# Patient Record
Sex: Female | Born: 1960 | Race: White | Hispanic: No | Marital: Married | Smoking: Current every day smoker
Health system: Southern US, Community
[De-identification: ages and names within clinical notes are randomized; demographics above are authoritative.]

## PROBLEM LIST (undated history)

## (undated) DIAGNOSIS — E079 Disorder of thyroid, unspecified: Secondary | ICD-10-CM

## (undated) DIAGNOSIS — Z860100 Personal history of colon polyps, unspecified: Secondary | ICD-10-CM

## (undated) DIAGNOSIS — F329 Major depressive disorder, single episode, unspecified: Secondary | ICD-10-CM

## (undated) DIAGNOSIS — T7840XA Allergy, unspecified, initial encounter: Secondary | ICD-10-CM

## (undated) DIAGNOSIS — M5030 Other cervical disc degeneration, unspecified cervical region: Secondary | ICD-10-CM

## (undated) DIAGNOSIS — F419 Anxiety disorder, unspecified: Secondary | ICD-10-CM

## (undated) DIAGNOSIS — D649 Anemia, unspecified: Secondary | ICD-10-CM

## (undated) DIAGNOSIS — K219 Gastro-esophageal reflux disease without esophagitis: Secondary | ICD-10-CM

## (undated) DIAGNOSIS — M1990 Unspecified osteoarthritis, unspecified site: Secondary | ICD-10-CM

## (undated) DIAGNOSIS — F32A Depression, unspecified: Secondary | ICD-10-CM

## (undated) HISTORY — DX: Gastro-esophageal reflux disease without esophagitis: K21.9

## (undated) HISTORY — DX: Disorder of thyroid, unspecified: E07.9

## (undated) HISTORY — DX: Allergy, unspecified, initial encounter: T78.40XA

## (undated) HISTORY — DX: Anxiety disorder, unspecified: F41.9

## (undated) HISTORY — PX: THYROIDECTOMY, PARTIAL: SHX18

## (undated) HISTORY — PX: BREAST BIOPSY: SHX20

## (undated) HISTORY — DX: Unspecified osteoarthritis, unspecified site: M19.90

## (undated) HISTORY — PX: TUBAL LIGATION: SHX77

## (undated) HISTORY — PX: CHOLECYSTECTOMY: SHX55

## (undated) HISTORY — DX: Anemia, unspecified: D64.9

## (undated) HISTORY — DX: Other cervical disc degeneration, unspecified cervical region: M50.30

## (undated) HISTORY — DX: Depression, unspecified: F32.A

## (undated) HISTORY — DX: Personal history of colonic polyps: Z86.010

## (undated) HISTORY — DX: Personal history of colon polyps, unspecified: Z86.0100

---
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
## 1997-05-14 ENCOUNTER — Other Ambulatory Visit: Admission: RE | Admit: 1997-05-14 | Discharge: 1997-05-14 | Payer: Self-pay | Admitting: Obstetrics & Gynecology

## 1997-06-08 ENCOUNTER — Inpatient Hospital Stay (HOSPITAL_COMMUNITY): Admission: AD | Admit: 1997-06-08 | Discharge: 1997-06-10 | Payer: Self-pay | Admitting: Obstetrics and Gynecology

## 1997-08-26 ENCOUNTER — Ambulatory Visit (HOSPITAL_COMMUNITY): Admission: RE | Admit: 1997-08-26 | Discharge: 1997-08-26 | Payer: Self-pay | Admitting: Obstetrics and Gynecology

## 1997-10-07 ENCOUNTER — Other Ambulatory Visit: Admission: RE | Admit: 1997-10-07 | Discharge: 1997-10-07 | Payer: Self-pay | Admitting: Obstetrics and Gynecology

## 1998-08-14 ENCOUNTER — Encounter: Payer: Self-pay | Admitting: General Surgery

## 1998-08-18 ENCOUNTER — Ambulatory Visit (HOSPITAL_COMMUNITY): Admission: RE | Admit: 1998-08-18 | Discharge: 1998-08-19 | Payer: Self-pay | Admitting: General Surgery

## 1999-01-08 ENCOUNTER — Other Ambulatory Visit: Admission: RE | Admit: 1999-01-08 | Discharge: 1999-01-08 | Payer: Self-pay | Admitting: Obstetrics and Gynecology

## 2001-04-19 ENCOUNTER — Other Ambulatory Visit: Admission: RE | Admit: 2001-04-19 | Discharge: 2001-04-19 | Payer: Self-pay | Admitting: Obstetrics and Gynecology

## 2001-05-02 ENCOUNTER — Encounter: Admission: RE | Admit: 2001-05-02 | Discharge: 2001-05-02 | Payer: Self-pay | Admitting: General Surgery

## 2001-05-02 ENCOUNTER — Encounter (INDEPENDENT_AMBULATORY_CARE_PROVIDER_SITE_OTHER): Payer: Self-pay | Admitting: Specialist

## 2001-05-02 ENCOUNTER — Encounter: Payer: Self-pay | Admitting: General Surgery

## 2002-04-30 ENCOUNTER — Other Ambulatory Visit: Admission: RE | Admit: 2002-04-30 | Discharge: 2002-04-30 | Payer: Self-pay | Admitting: Obstetrics and Gynecology

## 2003-05-01 ENCOUNTER — Other Ambulatory Visit: Admission: RE | Admit: 2003-05-01 | Discharge: 2003-05-01 | Payer: Self-pay | Admitting: Obstetrics and Gynecology

## 2004-04-29 ENCOUNTER — Other Ambulatory Visit: Admission: RE | Admit: 2004-04-29 | Discharge: 2004-04-29 | Payer: Self-pay | Admitting: Obstetrics and Gynecology

## 2008-07-02 ENCOUNTER — Encounter: Admission: RE | Admit: 2008-07-02 | Discharge: 2008-07-02 | Payer: Self-pay | Admitting: Obstetrics and Gynecology

## 2009-01-29 ENCOUNTER — Emergency Department (HOSPITAL_COMMUNITY): Admission: EM | Admit: 2009-01-29 | Discharge: 2009-01-29 | Payer: Self-pay | Admitting: Emergency Medicine

## 2009-02-05 ENCOUNTER — Encounter: Admission: RE | Admit: 2009-02-05 | Discharge: 2009-02-05 | Payer: Self-pay | Admitting: General Surgery

## 2010-03-22 LAB — URINALYSIS, ROUTINE W REFLEX MICROSCOPIC
Bilirubin Urine: NEGATIVE
Glucose, UA: NEGATIVE mg/dL
Hgb urine dipstick: NEGATIVE
Nitrite: NEGATIVE
Protein, ur: NEGATIVE mg/dL
Specific Gravity, Urine: 1.02 (ref 1.005–1.030)
Urobilinogen, UA: 0.2 mg/dL (ref 0.0–1.0)
pH: 6 (ref 5.0–8.0)

## 2010-03-22 LAB — COMPREHENSIVE METABOLIC PANEL
ALT: 77 U/L — ABNORMAL HIGH (ref 0–35)
AST: 56 U/L — ABNORMAL HIGH (ref 0–37)
Calcium: 9.1 mg/dL (ref 8.4–10.5)
GFR calc Af Amer: >60 mL/min (ref >60)
Sodium: 138 mEq/L (ref 135–145)
Total Protein: 7.1 g/dL (ref 6.0–8.3)

## 2010-03-22 LAB — CBC
MCHC: 33.2 g/dL (ref 30.0–36.0)
Platelets: 250 10*3/uL (ref 150–400)
RDW: 14.9 % (ref 11.5–15.5)

## 2010-03-22 LAB — DIFFERENTIAL
Eosinophils Absolute: 0.2 10*3/uL (ref 0.0–0.7)
Eosinophils Relative: 3 % (ref 0–5)
Lymphs Abs: 1.5 10*3/uL (ref 0.7–4.0)
Monocytes Relative: 6 % (ref 3–12)

## 2011-06-23 ENCOUNTER — Other Ambulatory Visit: Payer: Self-pay | Admitting: Obstetrics and Gynecology

## 2011-06-28 ENCOUNTER — Other Ambulatory Visit: Payer: Self-pay | Admitting: Obstetrics and Gynecology

## 2011-06-28 DIAGNOSIS — R928 Other abnormal and inconclusive findings on diagnostic imaging of breast: Secondary | ICD-10-CM

## 2011-07-01 ENCOUNTER — Ambulatory Visit
Admission: RE | Admit: 2011-07-01 | Discharge: 2011-07-01 | Disposition: A | Discharge status: Home / Self Care | Payer: BC Managed Care – PPO | Source: Ambulatory Visit | Attending: Obstetrics and Gynecology | Admitting: Obstetrics and Gynecology

## 2011-07-01 ENCOUNTER — Other Ambulatory Visit: Payer: Self-pay | Admitting: Obstetrics and Gynecology

## 2011-07-01 DIAGNOSIS — R928 Other abnormal and inconclusive findings on diagnostic imaging of breast: Secondary | ICD-10-CM

## 2012-07-03 ENCOUNTER — Other Ambulatory Visit: Payer: Self-pay | Admitting: Obstetrics and Gynecology

## 2012-07-17 ENCOUNTER — Other Ambulatory Visit: Payer: Self-pay | Admitting: Obstetrics and Gynecology

## 2012-07-17 DIAGNOSIS — R928 Other abnormal and inconclusive findings on diagnostic imaging of breast: Secondary | ICD-10-CM

## 2012-07-27 ENCOUNTER — Ambulatory Visit
Admission: RE | Admit: 2012-07-27 | Discharge: 2012-07-27 | Disposition: A | Discharge status: Home / Self Care | Payer: BC Managed Care – PPO | Source: Ambulatory Visit | Attending: Obstetrics and Gynecology | Admitting: Obstetrics and Gynecology

## 2012-07-27 DIAGNOSIS — R928 Other abnormal and inconclusive findings on diagnostic imaging of breast: Secondary | ICD-10-CM

## 2012-08-13 ENCOUNTER — Ambulatory Visit
Admission: RE | Admit: 2012-08-13 | Discharge: 2012-08-13 | Disposition: A | Discharge status: Home / Self Care | Payer: BC Managed Care – PPO | Source: Ambulatory Visit | Attending: Family Medicine | Admitting: Family Medicine

## 2012-08-13 ENCOUNTER — Other Ambulatory Visit: Payer: Self-pay | Admitting: Family Medicine

## 2012-08-13 DIAGNOSIS — R0989 Other specified symptoms and signs involving the circulatory and respiratory systems: Secondary | ICD-10-CM

## 2013-07-18 ENCOUNTER — Other Ambulatory Visit: Payer: Self-pay | Admitting: Obstetrics and Gynecology

## 2013-07-19 LAB — CYTOLOGY - PAP

## 2013-09-27 ENCOUNTER — Encounter: Payer: BC Managed Care – PPO | Admitting: Cardiology

## 2013-10-04 ENCOUNTER — Encounter (HOSPITAL_COMMUNITY): Payer: BC Managed Care – PPO

## 2013-10-09 ENCOUNTER — Telehealth (HOSPITAL_COMMUNITY): Payer: Self-pay

## 2013-10-09 ENCOUNTER — Other Ambulatory Visit (HOSPITAL_COMMUNITY): Payer: Self-pay | Admitting: Family Medicine

## 2013-10-09 DIAGNOSIS — R079 Chest pain, unspecified: Secondary | ICD-10-CM

## 2013-10-09 DIAGNOSIS — R0600 Dyspnea, unspecified: Secondary | ICD-10-CM

## 2013-10-09 NOTE — Telephone Encounter (Signed)
Encounter complete. 

## 2013-10-10 ENCOUNTER — Telehealth (HOSPITAL_COMMUNITY): Payer: Self-pay

## 2013-10-10 NOTE — Telephone Encounter (Signed)
Encounter complete. 

## 2013-10-11 ENCOUNTER — Encounter (HOSPITAL_COMMUNITY): Payer: BC Managed Care – PPO

## 2013-10-23 ENCOUNTER — Telehealth (HOSPITAL_COMMUNITY): Payer: Self-pay

## 2013-10-23 NOTE — Telephone Encounter (Signed)
Encounter complete. 

## 2013-10-25 ENCOUNTER — Encounter (HOSPITAL_COMMUNITY): Payer: BC Managed Care – PPO

## 2013-11-08 ENCOUNTER — Encounter (HOSPITAL_COMMUNITY): Payer: BC Managed Care – PPO

## 2014-02-21 HISTORY — PX: COLONOSCOPY: SHX174

## 2014-03-05 ENCOUNTER — Other Ambulatory Visit: Payer: Self-pay | Admitting: Family Medicine

## 2014-03-05 ENCOUNTER — Ambulatory Visit
Admission: RE | Admit: 2014-03-05 | Discharge: 2014-03-05 | Disposition: A | Discharge status: Home / Self Care | Payer: Self-pay | Source: Ambulatory Visit | Attending: Family Medicine | Admitting: Family Medicine

## 2014-03-05 DIAGNOSIS — R0789 Other chest pain: Secondary | ICD-10-CM

## 2014-07-29 ENCOUNTER — Other Ambulatory Visit: Payer: Self-pay | Admitting: Obstetrics and Gynecology

## 2014-07-30 LAB — CYTOLOGY - PAP

## 2015-08-06 ENCOUNTER — Other Ambulatory Visit: Payer: Self-pay | Admitting: Obstetrics and Gynecology

## 2015-08-07 LAB — CYTOLOGY - PAP

## 2016-03-04 ENCOUNTER — Other Ambulatory Visit: Payer: Self-pay | Admitting: Orthopedic Surgery

## 2016-03-04 DIAGNOSIS — M47812 Spondylosis without myelopathy or radiculopathy, cervical region: Secondary | ICD-10-CM

## 2016-03-16 ENCOUNTER — Other Ambulatory Visit: Payer: BLUE CROSS/BLUE SHIELD

## 2016-03-19 ENCOUNTER — Other Ambulatory Visit: Payer: BLUE CROSS/BLUE SHIELD

## 2016-04-01 ENCOUNTER — Ambulatory Visit
Admission: RE | Admit: 2016-04-01 | Discharge: 2016-04-01 | Disposition: A | Discharge status: Home / Self Care | Payer: Managed Care, Other (non HMO) | Source: Ambulatory Visit | Attending: Orthopedic Surgery | Admitting: Orthopedic Surgery

## 2016-04-01 DIAGNOSIS — M47812 Spondylosis without myelopathy or radiculopathy, cervical region: Secondary | ICD-10-CM

## 2017-03-22 ENCOUNTER — Encounter: Payer: Self-pay | Admitting: Gastroenterology

## 2017-08-25 ENCOUNTER — Other Ambulatory Visit: Payer: Self-pay | Admitting: Obstetrics and Gynecology

## 2017-08-25 DIAGNOSIS — R928 Other abnormal and inconclusive findings on diagnostic imaging of breast: Secondary | ICD-10-CM

## 2017-08-30 ENCOUNTER — Ambulatory Visit
Admission: RE | Admit: 2017-08-30 | Discharge: 2017-08-30 | Disposition: A | Discharge status: Home / Self Care | Payer: Managed Care, Other (non HMO) | Source: Ambulatory Visit | Attending: Obstetrics and Gynecology | Admitting: Obstetrics and Gynecology

## 2017-08-30 ENCOUNTER — Other Ambulatory Visit: Payer: Self-pay | Admitting: Obstetrics and Gynecology

## 2017-08-30 ENCOUNTER — Ambulatory Visit
Admission: RE | Admit: 2017-08-30 | Discharge: 2017-08-30 | Disposition: A | Discharge status: Home / Self Care | Payer: 59 | Source: Ambulatory Visit | Attending: Obstetrics and Gynecology | Admitting: Obstetrics and Gynecology

## 2017-08-30 DIAGNOSIS — N6310 Unspecified lump in the right breast, unspecified quadrant: Secondary | ICD-10-CM

## 2017-08-30 DIAGNOSIS — R928 Other abnormal and inconclusive findings on diagnostic imaging of breast: Secondary | ICD-10-CM

## 2017-08-30 DIAGNOSIS — N6320 Unspecified lump in the left breast, unspecified quadrant: Secondary | ICD-10-CM

## 2017-09-06 ENCOUNTER — Ambulatory Visit
Admission: RE | Admit: 2017-09-06 | Discharge: 2017-09-06 | Disposition: A | Discharge status: Home / Self Care | Payer: 59 | Source: Ambulatory Visit | Attending: Obstetrics and Gynecology | Admitting: Obstetrics and Gynecology

## 2017-09-06 DIAGNOSIS — N6310 Unspecified lump in the right breast, unspecified quadrant: Secondary | ICD-10-CM

## 2018-03-15 ENCOUNTER — Encounter: Payer: Self-pay | Admitting: Gastroenterology

## 2018-04-20 ENCOUNTER — Encounter: Payer: 59 | Admitting: Gastroenterology

## 2018-06-04 ENCOUNTER — Ambulatory Visit (AMBULATORY_SURGERY_CENTER): Payer: Self-pay

## 2018-06-04 ENCOUNTER — Other Ambulatory Visit: Payer: Self-pay

## 2018-06-04 VITALS — Ht 65.0 in | Wt 170.0 lb

## 2018-06-04 DIAGNOSIS — Z86010 Personal history of colonic polyps: Secondary | ICD-10-CM

## 2018-06-04 MED ORDER — NA SULFATE-K SULFATE-MG SULF 17.5-3.13-1.6 GM/177ML PO SOLN: 1.0000 | Freq: Once | ORAL | 0 refills | Status: AC

## 2018-06-04 NOTE — Progress Notes (Signed)
Denies allergies to eggs or soy products. Denies complication of anesthesia or sedation. Denies use of weight loss medication. Denies use of O2.   Emmi instructions given for colonoscopy.  Pre-Visit was conducted by phone due to Covid 19. Instructions were reviewed and mailed to patients confirmed address. Patient was encouraged to call if she had any questions or concerns regarding instructions. A 15.00 coupon for Suprep was given to the patient.

## 2018-06-05 ENCOUNTER — Encounter: Payer: Self-pay | Admitting: Gastroenterology

## 2018-06-15 ENCOUNTER — Telehealth: Payer: Self-pay

## 2018-06-15 NOTE — Telephone Encounter (Signed)
Left message with call back number.

## 2018-06-18 ENCOUNTER — Encounter: Payer: Self-pay | Admitting: Gastroenterology

## 2018-06-18 ENCOUNTER — Ambulatory Visit (AMBULATORY_SURGERY_CENTER): Payer: 59 | Admitting: Gastroenterology

## 2018-06-18 ENCOUNTER — Other Ambulatory Visit: Payer: Self-pay

## 2018-06-18 VITALS — BP 108/50 | HR 66 | Temp 98.5°F | Resp 12 | Ht 65.0 in | Wt 170.0 lb

## 2018-06-18 DIAGNOSIS — D123 Benign neoplasm of transverse colon: Secondary | ICD-10-CM

## 2018-06-18 DIAGNOSIS — Z86010 Personal history of colonic polyps: Secondary | ICD-10-CM

## 2018-06-18 MED ORDER — SODIUM CHLORIDE 0.9 % IV SOLN: 500.0000 mL | Freq: Once | INTRAVENOUS | Status: DC

## 2018-06-18 NOTE — Progress Notes (Signed)
Called to room to assist during endoscopic procedure.  Patient ID and intended procedure confirmed with present staff. Received instructions for my participation in the procedure from the performing physician.  

## 2018-06-18 NOTE — Op Note (Signed)
[facility] Patient Name: Rachael Wells Procedure Date: 06/18/2018 10:47 AM MRN: [id number] Endoscopist: Jackquline Denmark , MD Age: 58 Referring MD:  Date of Birth: 09-21-1960 Gender: Female Account #: [id number] Procedure:                Colonoscopy Indications:              History of colonic polyps. Family history of colon                            cancer in a first-degree relative. Medicines:                Monitored Anesthesia Care Procedure:                Pre-Anesthesia Assessment:                           - Prior to the procedure, a History and Physical                            was performed, and patient medications and                            allergies were reviewed. The patient's tolerance of                            previous anesthesia was also reviewed. The risks                            and benefits of the procedure and the sedation                            options and risks were discussed with the patient.                            All questions were answered, and informed consent                            was obtained. Prior Anticoagulants: The patient has                            taken no previous anticoagulant or antiplatelet                            agents. ASA Grade Assessment: II - A patient with                            mild systemic disease. After reviewing the risks                            and benefits, the patient was deemed in                            satisfactory condition to undergo the procedure.  After obtaining informed consent, the colonoscope                            was passed under direct vision. Throughout the                            procedure, the patient's blood pressure, pulse, and                            oxygen saturations were monitored continuously. The                            Colonoscope was introduced through the anus and                            advanced to the the cecum,  identified by                            appendiceal orifice and ileocecal valve. The                            colonoscopy was performed without difficulty. The                            patient tolerated the procedure well. The quality                            of the bowel preparation was good. Scope In: 11:00:10 AM Scope Out: 11:28:36 AM Scope Withdrawal Time: 0 hours 21 minutes 36 seconds  Total Procedure Duration: 0 hours 28 minutes 26 seconds  Findings:                 A 12 mm polyp was found in the mid transverse                            colon. The polyp was sessile and somewhat difficult                            to remove. The polyp was removed with a saline                            injection-lift technique using a hot snare.                            Resection and retrieval were complete. Area was                            tattooed with with 2 mL Spot.                           Multiple small-mouthed diverticula were found in                            the sigmoid colon.  Non-bleeding internal hemorrhoids were found during                            retroflexion. The hemorrhoids were small. Complications:            No immediate complications. Estimated Blood Loss:     Estimated blood loss: none. Impression:               -Colonic polyp status post polypectomy.                           -Moderate sigmoid diverticulosis.                           -Internal hemorrhoids. Recommendation:           - Patient has a contact number available for                            emergencies. The signs and symptoms of potential                            delayed complications were discussed with the                            patient. Return to normal activities tomorrow.                            Written discharge instructions were provided to the                            patient.                           - Resume previous diet.                            - Continue present medications.                           - No aspirin, ibuprofen, naproxen, or other                            non-steroidal anti-inflammatory drugs for 5 days                            after polyp removal.                           - Repeat colonoscopy for surveillance based on                            pathology results. If the above polyp is                            adenomatous, she would need repeat colonoscopy in 6  months to 1 year to ensure complete removal.                           - Return to GI clinic in 12 weeks. Jackquline Denmark, MD 06/18/2018 11:39:58 AM This report has been signed electronically.

## 2018-06-18 NOTE — Progress Notes (Signed)
PT taken to PACU. Monitors in place. VSS. Report given to RN. 

## 2018-06-18 NOTE — Patient Instructions (Signed)
Information on polyps, diverticulosis and hemorrhoids given to you today.  Await pathology results.  No aspirin, ibuprofen, naproxen or other non steroidal anti inflammatory meds for 5 days after procedure.  YOU HAD AN ENDOSCOPIC PROCEDURE TODAY AT [facility]:   Refer to the procedure report that was given to you for any specific questions about what was found during the examination.  If the procedure report does not answer your questions, please call your gastroenterologist to clarify.  If you requested that your care partner not be given the details of your procedure findings, then the procedure report has been included in a sealed envelope for you to review at your convenience later.  YOU SHOULD EXPECT: Some feelings of bloating in the abdomen. Passage of more gas than usual.  Walking can help get rid of the air that was put into your GI tract during the procedure and reduce the bloating. If you had a lower endoscopy (such as a colonoscopy or flexible sigmoidoscopy) you may notice spotting of blood in your stool or on the toilet paper. If you underwent a bowel prep for your procedure, you may not have a normal bowel movement for a few days.  Please Note:  You might notice some irritation and congestion in your nose or some drainage.  This is from the oxygen used during your procedure.  There is no need for concern and it should clear up in a day or so.  SYMPTOMS TO REPORT IMMEDIATELY:   Following lower endoscopy (colonoscopy or flexible sigmoidoscopy):  Excessive amounts of blood in the stool  Significant tenderness or worsening of abdominal pains  Swelling of the abdomen that is new, acute  Fever of 100F or higher  For urgent or emergent issues, a gastroenterologist can be reached at any hour by calling [phone].   DIET:  We do recommend a small meal at first, but then you may proceed to your regular diet.  Drink plenty of fluids but you should avoid alcoholic  beverages for 24 hours.  ACTIVITY:  You should plan to take it easy for the rest of today and you should NOT DRIVE or use heavy machinery until tomorrow (because of the sedation medicines used during the test).    FOLLOW UP: Our staff will call the number listed on your records 48-72 hours following your procedure to check on you and address any questions or concerns that you may have regarding the information given to you following your procedure. If we do not reach you, we will leave a message.  We will attempt to reach you two times.  During this call, we will ask if you have developed any symptoms of COVID 19. If you develop any symptoms (ie: fever, flu-like symptoms, shortness of breath, cough etc.) before then, please call [phone].  If you test positive for Covid 19 in the 2 weeks post procedure, please call and report this information to [place].    If any biopsies were taken you will be contacted by phone or by letter within the next 1-3 weeks.  Please call us at [phone] if you have not heard about the biopsies in 3 weeks.    SIGNATURES/CONFIDENTIALITY: You and/or your care partner have signed paperwork which will be entered into your electronic medical record.  These signatures attest to the fact that that the information above on your After Visit Summary has been reviewed and is understood.  Full responsibility of the confidentiality of this discharge information lies with  you and/or your care-partner.

## 2018-06-20 ENCOUNTER — Telehealth: Payer: Self-pay

## 2018-06-20 NOTE — Telephone Encounter (Signed)
  Follow up Call-  Call back number 06/18/2018  Post procedure Call Back phone  # [phone]  Permission to leave phone message Yes  Some recent data might be hidden     Patient questions:  Do you have a fever, pain , or abdominal swelling? YES some bloating and gas. RN recommended sipping on warm liquid such as coffee or hot tea.  Something Like Gas-X OTC is an option as well.  Movement esp walking may help.  Pt Is at work and active now.  RN encouraged pt to call back if no improvement,  New or worsening symptoms.  Pt agreed. Pain Score  0 *  Have you tolerated food without any problems? Yes.    Have you been able to return to your normal activities? Yes.    Do you have any questions about your discharge instructions: Diet   No. Medications  No. Follow up visit  No.  Do you have questions or concerns about your Care? No.  Actions: * If pain score is 4 or above: No action needed, pain <4.  1. Have you developed a fever since your procedure? no  2.   Have you had an respiratory symptoms (SOB or cough) since your procedure? no  3.   Have you tested positive for COVID 19 since your procedure no  4.   Have you had any family members/close contacts diagnosed with the COVID 19 since your procedure?  no   If yes to any of these questions please route to Joylene John, RN and Alphonsa Gin, Therapist, sports.

## 2018-07-02 ENCOUNTER — Encounter: Payer: Self-pay | Admitting: Gastroenterology

## 2018-09-08 ENCOUNTER — Encounter (HOSPITAL_COMMUNITY): Payer: Self-pay | Admitting: Emergency Medicine

## 2018-09-08 ENCOUNTER — Emergency Department (HOSPITAL_COMMUNITY)
Admission: EM | Admit: 2018-09-08 | Discharge: 2018-09-08 | Disposition: A | Discharge status: Home / Self Care | Payer: 59 | Attending: Emergency Medicine | Admitting: Emergency Medicine

## 2018-09-08 ENCOUNTER — Emergency Department (HOSPITAL_COMMUNITY): Payer: 59

## 2018-09-08 ENCOUNTER — Other Ambulatory Visit: Payer: Self-pay

## 2018-09-08 DIAGNOSIS — R221 Localized swelling, mass and lump, neck: Secondary | ICD-10-CM | POA: Diagnosis present

## 2018-09-08 DIAGNOSIS — F17210 Nicotine dependence, cigarettes, uncomplicated: Secondary | ICD-10-CM | POA: Diagnosis not present

## 2018-09-08 DIAGNOSIS — K1120 Sialoadenitis, unspecified: Secondary | ICD-10-CM | POA: Diagnosis not present

## 2018-09-08 LAB — BASIC METABOLIC PANEL
Anion gap: 12 (ref 5–15)
BUN: 7 mg/dL (ref 6–20)
CO2: 25 mmol/L (ref 22–32)
Calcium: 9.3 mg/dL (ref 8.9–10.3)
Chloride: 102 mmol/L (ref 98–111)
Creatinine, Ser: 0.67 mg/dL (ref 0.44–1.00)
GFR calc Af Amer: >60 mL/min (ref >60)
GFR calc non Af Amer: >60 mL/min (ref >60)
Glucose, Bld: 114 mg/dL — ABNORMAL HIGH (ref 70–99)
Potassium: 3.7 mmol/L (ref 3.5–5.1)
Sodium: 139 mmol/L (ref 135–145)

## 2018-09-08 LAB — CBC WITH DIFFERENTIAL/PLATELET
Abs Immature Granulocytes: 0.06 10*3/uL (ref 0.00–0.07)
Basophils Absolute: 0.1 10*3/uL (ref 0.0–0.1)
Basophils Relative: 1 %
Eosinophils Absolute: 0.4 10*3/uL (ref 0.0–0.5)
Eosinophils Relative: 3 %
HCT: 40.6 % (ref 36.0–46.0)
Hemoglobin: 13 g/dL (ref 12.0–15.0)
Immature Granulocytes: 1 %
Lymphocytes Relative: 11 %
Lymphs Abs: 1.4 10*3/uL (ref 0.7–4.0)
MCH: 28.4 pg (ref 26.0–34.0)
MCHC: 32 g/dL (ref 30.0–36.0)
MCV: 88.8 fL (ref 80.0–100.0)
Monocytes Absolute: 1 10*3/uL (ref 0.1–1.0)
Monocytes Relative: 7 %
Neutro Abs: 10.3 10*3/uL — ABNORMAL HIGH (ref 1.7–7.7)
Neutrophils Relative %: 77 %
Platelets: 202 10*3/uL (ref 150–400)
RBC: 4.57 MIL/uL (ref 3.87–5.11)
RDW: 12.8 % (ref 11.5–15.5)
WBC: 13.2 10*3/uL — ABNORMAL HIGH (ref 4.0–10.5)
nRBC: 0 % (ref 0.0–0.2)

## 2018-09-08 MED ORDER — CLINDAMYCIN HCL 150 MG PO CAPS
300.0000 mg | ORAL_CAPSULE | Freq: Once | ORAL | Status: AC
  Administered 2018-09-08: 300 mg via ORAL
  Filled 2018-09-08: qty 2

## 2018-09-08 MED ORDER — HYDROCODONE-ACETAMINOPHEN 5-325 MG PO TABS: 1.0000 | ORAL_TABLET | Freq: Four times a day (QID) | ORAL | 0 refills | Status: DC | PRN

## 2018-09-08 MED ORDER — SODIUM CHLORIDE 0.9 % IV BOLUS
500.0000 mL | Freq: Once | INTRAVENOUS | Status: AC
  Administered 2018-09-08: 500 mL via INTRAVENOUS

## 2018-09-08 MED ORDER — IOHEXOL 300 MG/ML  SOLN
75.0000 mL | Freq: Once | INTRAMUSCULAR | Status: AC | PRN
  Administered 2018-09-08: 75 mL via INTRAVENOUS

## 2018-09-08 MED ORDER — CLINDAMYCIN HCL 150 MG PO CAPS: 300.0000 mg | ORAL_CAPSULE | Freq: Three times a day (TID) | ORAL | 0 refills | Status: AC

## 2018-09-08 MED ORDER — KETOROLAC TROMETHAMINE 30 MG/ML IJ SOLN
15.0000 mg | Freq: Once | INTRAMUSCULAR | Status: AC
  Administered 2018-09-08: 15 mg via INTRAVENOUS
  Filled 2018-09-08: qty 1

## 2018-09-08 MED ORDER — NAPROXEN 500 MG PO TABS: 500.0000 mg | ORAL_TABLET | Freq: Two times a day (BID) | ORAL | 0 refills | Status: AC

## 2018-09-08 NOTE — ED Notes (Signed)
Patient verbalizes understanding of discharge instructions. Opportunity for questioning and answers were provided. Armband removed by staff, pt discharged from ED.  

## 2018-09-08 NOTE — Discharge Instructions (Addendum)
As discussed, it is important that you take your newly prescribed antibiotics as directed (stop the Augmentin), and schedule follow-up with our ENT colleague, Dr. Constance Holster.  Return here for concerning changes in your condition.

## 2018-09-08 NOTE — ED Notes (Signed)
ED Provider at bedside. 

## 2018-09-08 NOTE — ED Notes (Signed)
Patient transported to CT 

## 2018-09-08 NOTE — ED Triage Notes (Signed)
Patient reports right neck pain with swelling onset this week , prescribed with Augmentin by her PCP with no improvement , airway intact /respirations unlabored. Denies fever or chills .

## 2018-09-08 NOTE — ED Provider Notes (Signed)
[facility] EMERGENCY DEPARTMENT Provider Note   CSN: [id number] Arrival date & time: 09/08/18  [id number]     History   Chief Complaint Chief Complaint  Patient presents with  . Neck Swelling    HPI GIANNIE Wells is a 58 y.o. female.     HPI Patient presents with concern of neck swelling. Patient states that she is generally well, denies chronic medical issues, does smoke cigarettes. 3 days ago the patient began to have swelling and pain in the right infra mandibular area.  With increasing discomfort she saw her physician, and started Augmentin 2 days ago. She has taken 3 total doses of Augmentin, and OTC pain medication, but has increasing pain, erythema, swelling in the right lateral neck.  The pain is sharp, severe, worse with motion or palpation. No vomiting, though she is nauseous, no headache, no fever, no chest pain, no dyspnea, no dysphagia.   Past Medical History:  Diagnosis Date  . Allergy   . Anemia   . Arthritis   . Depression   . GERD (gastroesophageal reflux disease)   . Thyroid disease     There are no active problems to display for this patient.   Past Surgical History:  Procedure Laterality Date  . BREAST BIOPSY Bilateral   . CHOLECYSTECTOMY    . THYROIDECTOMY, PARTIAL Right   . TUBAL LIGATION       OB History   No obstetric history on file.      Home Medications    Prior to Admission medications   Medication Sig Start Date End Date Taking? Authorizing Provider  acyclovir (ZOVIRAX) 400 MG tablet acyclovir 400 mg tablet  TK 1 T PO PRN UTD    [provider]  citalopram (CELEXA) 20 MG tablet Take 20 mg by mouth daily.    [provider]  OVER THE COUNTER MEDICATION B 12 one tablet daily.    [provider]    Family History Family History  Problem Relation Age of Onset  . Colon cancer Father   . Rectal cancer Father   . Breast cancer Neg Hx   . Esophageal cancer Neg Hx   . Stomach cancer  Neg Hx     Social History Social History   Tobacco Use  . Smoking status: Current Every Day Smoker  . Smokeless tobacco: Never Used  Substance Use Topics  . Alcohol use: Yes    Comment: occasional beer or mix drink  . Drug use: Never     Allergies   Patient has no known allergies.   Review of Systems Review of Systems  Constitutional:       Per HPI, otherwise negative  HENT:       Per HPI, otherwise negative  Respiratory:       Per HPI, otherwise negative  Cardiovascular:       Per HPI, otherwise negative  Gastrointestinal: Negative for vomiting.  Endocrine:       Negative aside from HPI  Genitourinary:       Neg aside from HPI   Musculoskeletal:       Per HPI, otherwise negative  Skin: Negative.   Neurological: Negative for syncope.     Physical Exam Updated Vital Signs BP (!) 128/54   Pulse 70   Temp 98.2 F (36.8 C) (Oral)   Resp 16   SpO2 98%   Physical Exam Vitals signs and nursing note reviewed.  Constitutional:      General: She is  not in acute distress.    Appearance: She is well-developed.     Comments: Uncomfortable appearing adult female awake and alert  HENT:     Head: Normocephalic.      Right Ear: Tympanic membrane normal. No mastoid tenderness.     Mouth/Throat:     Pharynx: Oropharynx is clear. No posterior oropharyngeal erythema or uvula swelling.  Eyes:     Conjunctiva/sclera: Conjunctivae normal.  Cardiovascular:     Rate and Rhythm: Normal rate and regular rhythm.  Pulmonary:     Effort: Pulmonary effort is normal. No respiratory distress.     Breath sounds: Normal breath sounds. No stridor.  Abdominal:     General: There is no distension.  Skin:    General: Skin is warm and dry.  Neurological:     Mental Status: She is alert and oriented to person, place, and time.     Cranial Nerves: No cranial nerve deficit.      ED Treatments / Results  Labs (all labs ordered are listed, but only abnormal results are  displayed) Labs Reviewed  CBC WITH DIFFERENTIAL/PLATELET - Abnormal; Notable for the following components:      Result Value   WBC 13.2 (*)    Neutro Abs 10.3 (*)    All other components within normal limits  BASIC METABOLIC PANEL - Abnormal; Notable for the following components:   Glucose, Bld 114 (*)    All other components within normal limits    EKG None  Radiology Ct Soft Tissue Neck W Contrast  Result Date: 09/08/2018 CLINICAL DATA:  Sore throat or stridor. EXAM: CT NECK WITH CONTRAST TECHNIQUE: Multidetector CT imaging of the neck was performed using the standard protocol following the bolus administration of intravenous contrast. CONTRAST:  93mL OMNIPAQUE IOHEXOL 300 MG/ML  SOLN COMPARISON:  None. FINDINGS: Pharynx and larynx: No evidence of mass or swelling Salivary glands: Enlarged right submandibular gland with regional fat edema which also involves the tail of the right parotid. No underlying obstructing stone is seen, although limited by degree of streak artifact. Vague rounded density in the right parotid tail measuring 12 mm. Thyroid: Right hemithyroidectomy.  Unremarkable remaining left lobe. Lymph nodes: Enlarged right jugulodigastric node considered reactive in this setting. Vascular: Negative Limited intracranial: Negative Visualized orbits: Negative Mastoids and visualized paranasal sinuses: Clear Skeleton: Advanced cervical facet spurring asymmetric to the left. Lower cervical disc narrowing and endplate ridging. Upper chest: Negative IMPRESSION: 1. Right submandibular sialadenitis without visible underlying stone. Mild right parotiditis. 2. Suspect 12 mm mass in the right parotid tail, recommend follow-up after convalescence. Electronically Signed   By: Monte Fantasia M.D.   On: 09/08/2018 11:08    Procedures Procedures (including critical care time)  Medications Ordered in ED Medications  sodium chloride 0.9 % bolus 500 mL (500 mLs Intravenous New Bag/Given 09/08/18  0904)  ketorolac (TORADOL) 30 MG/ML injection 15 mg (15 mg Intravenous Given 09/08/18 0906)  iohexol (OMNIPAQUE) 300 MG/ML solution 75 mL (75 mLs Intravenous Contrast Given 09/08/18 1015)     Initial Impression / Assessment and Plan / ED Course  I have reviewed the triage vital signs and the nursing notes.  Pertinent labs & imaging results that were available during my care of the patient were reviewed by me and considered in my medical decision making (see chart for details).    Patient substantially better after receiving analgesia.    11:43 AM I have discussed the patient's CT scan, presentation with our ENT colleague on-call.  With concern for peritonitis, CL adenitis, patient will switch to clindamycin. Given this and potential mass, she will follow-up closely in the ENT clinic this week. With resolution of pain, no hemodynamic instability she is appropriate for discharge with close outpatient follow-up.  I had a very lengthy conversation with the patient and her husband about all findings, importance of follow-up. After switching to clindamycin, per recommendation, patient will be discharged in stable condition.  Final Clinical Impressions(s) / ED Diagnoses   Final diagnoses:  Sialadenitis    ED Discharge Orders         Ordered    clindamycin (CLEOCIN) 150 MG capsule  3 times daily     09/08/18 1148    HYDROcodone-acetaminophen (NORCO/VICODIN) 5-325 MG tablet  Every 6 hours PRN     09/08/18 1148    naproxen (NAPROSYN) 500 MG tablet  2 times daily with meals     09/08/18 1148           Carmin Muskrat, MD 09/08/18 1149

## 2018-09-08 NOTE — ED Notes (Signed)
GOT PATIENT INTO A GOWN ON THE MONITOR PATIENT IS RESTING WITH CALL BELL IN REACH 

## 2018-10-04 ENCOUNTER — Other Ambulatory Visit: Payer: Self-pay | Admitting: Otolaryngology

## 2018-10-04 DIAGNOSIS — R599 Enlarged lymph nodes, unspecified: Secondary | ICD-10-CM

## 2018-10-04 DIAGNOSIS — L0211 Cutaneous abscess of neck: Secondary | ICD-10-CM

## 2018-10-08 ENCOUNTER — Other Ambulatory Visit: Payer: Self-pay | Admitting: Otolaryngology

## 2018-10-08 DIAGNOSIS — R599 Enlarged lymph nodes, unspecified: Secondary | ICD-10-CM

## 2018-10-08 DIAGNOSIS — L0211 Cutaneous abscess of neck: Secondary | ICD-10-CM

## 2018-10-12 ENCOUNTER — Ambulatory Visit
Admission: RE | Admit: 2018-10-12 | Discharge: 2018-10-12 | Disposition: A | Discharge status: Home / Self Care | Payer: 59 | Source: Ambulatory Visit | Attending: Otolaryngology | Admitting: Otolaryngology

## 2018-10-12 DIAGNOSIS — L0211 Cutaneous abscess of neck: Secondary | ICD-10-CM

## 2018-10-12 DIAGNOSIS — R599 Enlarged lymph nodes, unspecified: Secondary | ICD-10-CM

## 2018-10-12 MED ORDER — IOPAMIDOL (ISOVUE-300) INJECTION 61%
75.0000 mL | Freq: Once | INTRAVENOUS | Status: AC | PRN
  Administered 2018-10-12: 12:00:00 75 mL via INTRAVENOUS

## 2018-12-25 ENCOUNTER — Encounter: Payer: Self-pay | Admitting: Gastroenterology

## 2019-02-04 ENCOUNTER — Encounter: Payer: Self-pay | Admitting: Gastroenterology

## 2019-04-23 ENCOUNTER — Telehealth: Payer: Self-pay | Admitting: Gastroenterology

## 2019-04-24 NOTE — Telephone Encounter (Signed)
Patient scheduled for an office visit on 05/17/19.

## 2019-05-17 ENCOUNTER — Encounter: Payer: Self-pay | Admitting: Gastroenterology

## 2019-05-17 ENCOUNTER — Other Ambulatory Visit: Payer: Self-pay

## 2019-05-17 ENCOUNTER — Ambulatory Visit: Payer: 59 | Admitting: Gastroenterology

## 2019-05-17 VITALS — BP 124/72 | HR 81 | Temp 98.4°F | Ht 65.0 in | Wt 175.4 lb

## 2019-05-17 DIAGNOSIS — Z86010 Personal history of colonic polyps: Secondary | ICD-10-CM

## 2019-05-17 DIAGNOSIS — Z800 Family history of malignant neoplasm of digestive organs: Secondary | ICD-10-CM

## 2019-05-17 DIAGNOSIS — R1011 Right upper quadrant pain: Secondary | ICD-10-CM | POA: Diagnosis not present

## 2019-05-17 DIAGNOSIS — K580 Irritable bowel syndrome with diarrhea: Secondary | ICD-10-CM | POA: Diagnosis not present

## 2019-05-17 DIAGNOSIS — Z01818 Encounter for other preprocedural examination: Secondary | ICD-10-CM

## 2019-05-17 MED ORDER — CLENPIQ 10-3.5-12 MG-GM -GM/160ML PO SOLN: 1.0000 | Freq: Once | ORAL | 0 refills | Status: AC

## 2019-05-17 NOTE — Patient Instructions (Addendum)
If you are age 58 or older, your body mass index should be between 23-30. Your Body mass index is 29.18 kg/m. If this is out of the aforementioned range listed, please consider follow up with your Primary Care Provider.  If you are age 67 or younger, your body mass index should be between 19-25. Your Body mass index is 29.18 kg/m. If this is out of the aformentioned range listed, please consider follow up with your Primary Care Provider.   It has been recommended to you by your physician that you have a(n) EGD/Colonoscopy completed at [facility]. We did not schedule this at this time, you will be contacted with the date and time for this once it is available.   Use heating pads/ Biofreeze for 2 weeks  Continue Omeprazole once daily for 2 weeks.    Thank you,  Dr. Jackquline Denmark

## 2019-05-17 NOTE — Progress Notes (Signed)
Chief Complaint:   Referring Provider:  Cyndi Bender, PA-C      ASSESSMENT AND PLAN;   #1. H/O Polyps 06/2018 s/p EMR  #2. 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 RUQ pain. Likely musculoskeltal. R/o other causes.  She does have associated gastroesophageal reflux.   Plan: -EGD/Colon in 3-4 weeks at [facility].  I have discussed risks and benefits. -Continue omeprazole 20mg  po qd x 2 weeks. -Heating pads/Biofreeze x 2 weeks -If still with problems, [place] abdo. -Blood work reports from [employer].   HPI:    Rachael Wells is a 59 y.o. female   occ diarrhea since cholecystectomy. Has longstanding history of right upper quadrant abdominal pain.  Does get worse with movement.  Not related to eating.  No nausea, vomiting, regurgitation, odynophagia or dysphagia. She does have occasional heartburn. Very upset since her dad who has metastatic colon cancer is currently on hospice.  No nausea, vomiting, regurgitation, odynophagia or dysphagia.  No constipation.  No melena or hematochezia. No unintentional weight loss.   S/P colonoscopy 06/18/2018 with EMR of flat 12 mm transverse colonic polyp. bx- TA.  Advised to repeat colonoscopy in 6 months.  No sodas, chocolates, chewing gums, artificial sweeteners and candy. No NSAIDs     Past Medical History:  Diagnosis Date  . Allergy   . Anemia   . Anxiety   . Arthritis   . Depression   . GERD (gastroesophageal reflux disease)   . History of colon polyps   . Thyroid disease     Past Surgical History:  Procedure Laterality Date  . BREAST BIOPSY Bilateral   . CHOLECYSTECTOMY    . COLONOSCOPY  02/21/2014   Rectal polyp-status post polypectomy. Mild sigmoid diverticulosis. Small internal hemorrhoids  . THYROIDECTOMY, PARTIAL Right   . TUBAL LIGATION      Family History  Problem Relation Age of Onset  . Colon cancer Father   . Rectal cancer Father   . Breast  cancer Neg Hx   . Esophageal cancer Neg Hx   . Stomach cancer Neg Hx     Social History   Tobacco Use  . Smoking status: Current Every Day Smoker  . Smokeless tobacco: Never Used  Substance Use Topics  . Alcohol use: Yes    Comment: occasional beer or mix drink  . Drug use: Never    Current Outpatient Medications  Medication Sig Dispense Refill  . citalopram (CELEXA) 20 MG tablet Take 20 mg by mouth daily.    . Multiple Vitamin (MULTIVITAMIN) tablet Take 1 tablet by mouth as needed.    Marland Kitchen acyclovir (ZOVIRAX) 400 MG tablet acyclovir 400 mg tablet  TK 1 T PO PRN UTD    . Cyanocobalamin (VITAMIN B-12 PO) Take by mouth as needed.      No current facility-administered medications for this visit.    No Known Allergies  Review of Systems:  neg     Physical Exam:    BP 124/72   Pulse 81   Temp 98.4 F (36.9 C)   Ht 5\' 5"  (1.651 m)   Wt 175 lb 6 oz (79.5 kg)   BMI 29.18 kg/m  Wt Readings from Last 3 Encounters:  05/17/19 175 lb 6 oz (79.5 kg)  06/18/18 170 lb (77.1 kg)  06/04/18 170 lb (77.1 kg)   Constitutional:  Well-developed, in no acute distress. Psychiatric: Normal mood and affect. Behavior  is normal. HEENT: Pupils normal.  Conjunctivae are normal. No scleral icterus. Neck supple.  Cardiovascular: Normal rate, regular rhythm. No edema Pulmonary/chest: Effort normal and breath sounds normal. No wheezing, rales or rhonchi. Abdominal: Soft, nondistended.  Right chest wall reproducible tenderness.  Bowel sounds active throughout. There are no masses palpable. No hepatomegaly. Rectal:  defered Neurological: Alert and oriented to person place and time. Skin: Skin is warm and dry. No rashes noted.  Data Reviewed: I have personally reviewed following labs and imaging studies  CBC: CBC Latest Ref Rng & Units 09/08/2018 01/29/2009  WBC 4.0 - 10.5 K/uL 13.2(H) 7.0  Hemoglobin 12.0 - 15.0 g/dL 13.0 11.4(L)  Hematocrit 36.0 - 46.0 % 40.6 34.2(L)  Platelets 150 - 400  K/uL 202 250    CMP: CMP Latest Ref Rng & Units 09/08/2018 01/29/2009  Glucose 70 - 99 mg/dL 114(H) 83  BUN 6 - 20 mg/dL 7 12  Creatinine 0.44 - 1.00 mg/dL 0.67 0.68  Sodium 135 - 145 mmol/L 139 138  Potassium 3.5 - 5.1 mmol/L 3.7 3.9  Chloride 98 - 111 mmol/L 102 104  CO2 22 - 32 mmol/L 25 24  Calcium 8.9 - 10.3 mg/dL 9.3 9.1  Total Protein 6.0 - 8.3 g/dL - 7.1  Total Bilirubin 0.3 - 1.2 mg/dL - 0.5  Alkaline Phos 39 - 117 U/L - 75  AST 0 - 37 U/L - 56(H)  ALT 0 - 35 U/L - 77(H)      Carmell Austria, MD 05/17/2019, 9:46 AM  Cc: Cyndi Bender, PA-C

## 2019-05-29 ENCOUNTER — Telehealth: Payer: Self-pay | Admitting: Gastroenterology

## 2019-05-29 NOTE — Telephone Encounter (Signed)
Lets get it done in [place]

## 2019-05-29 NOTE — Telephone Encounter (Signed)
Pt calling to inquire about EDG/colon that needs to be scheduled at [place] call her.

## 2019-05-30 ENCOUNTER — Other Ambulatory Visit: Payer: Self-pay

## 2019-05-30 DIAGNOSIS — Z01818 Encounter for other preprocedural examination: Secondary | ICD-10-CM

## 2019-05-30 DIAGNOSIS — R1011 Right upper quadrant pain: Secondary | ICD-10-CM

## 2019-05-30 DIAGNOSIS — Z86010 Personal history of colonic polyps: Secondary | ICD-10-CM

## 2019-05-30 DIAGNOSIS — K580 Irritable bowel syndrome with diarrhea: Secondary | ICD-10-CM

## 2019-05-30 DIAGNOSIS — Z800 Family history of malignant neoplasm of digestive organs: Secondary | ICD-10-CM

## 2019-05-30 NOTE — Telephone Encounter (Signed)
Spoke with patient - Per Dr Lyndel Safe okay to have procedure at [facility].  Pt has been scheduled for 06/21/19 at 3 pm. Covid screen 06/19/19 at 11:10 am.  Pt states she can get her prep instructions from [facility]. I called pt to go over instructions with her.  Pt to pick up prep at pharmacy.  Patient verbalized understanding.

## 2019-06-07 ENCOUNTER — Encounter: Payer: Self-pay | Admitting: Gastroenterology

## 2019-06-18 ENCOUNTER — Telehealth: Payer: Self-pay

## 2019-06-18 NOTE — Telephone Encounter (Signed)
Left message on patients voicemail regarding an opening at [facility] for endo/colon for 06/24/19.  Requested that she return my call immediately if interested in getting this date.

## 2019-06-19 ENCOUNTER — Other Ambulatory Visit (HOSPITAL_COMMUNITY)
Admission: RE | Admit: 2019-06-19 | Discharge: 2019-06-19 | Disposition: A | Discharge status: Home / Self Care | Payer: 59 | Source: Ambulatory Visit | Attending: Gastroenterology | Admitting: Gastroenterology

## 2019-06-19 ENCOUNTER — Ambulatory Visit (INDEPENDENT_AMBULATORY_CARE_PROVIDER_SITE_OTHER): Payer: 59

## 2019-06-19 ENCOUNTER — Encounter: Payer: 59 | Admitting: Gastroenterology

## 2019-06-19 DIAGNOSIS — Z01812 Encounter for preprocedural laboratory examination: Secondary | ICD-10-CM | POA: Insufficient documentation

## 2019-06-19 DIAGNOSIS — Z1159 Encounter for screening for other viral diseases: Secondary | ICD-10-CM

## 2019-06-19 DIAGNOSIS — Z20822 Contact with and (suspected) exposure to covid-19: Secondary | ICD-10-CM | POA: Insufficient documentation

## 2019-06-19 LAB — SARS CORONAVIRUS 2 (TAT 6-24 HRS): SARS Coronavirus 2: NEGATIVE

## 2019-06-21 ENCOUNTER — Encounter: Payer: Self-pay | Admitting: Gastroenterology

## 2019-06-21 ENCOUNTER — Other Ambulatory Visit: Payer: Self-pay

## 2019-06-21 ENCOUNTER — Ambulatory Visit (AMBULATORY_SURGERY_CENTER): Payer: 59 | Admitting: Gastroenterology

## 2019-06-21 ENCOUNTER — Other Ambulatory Visit: Payer: Self-pay | Admitting: Gastroenterology

## 2019-06-21 VITALS — BP 113/62 | HR 63 | Temp 98.9°F | Resp 12 | Ht 65.0 in | Wt 175.0 lb

## 2019-06-21 DIAGNOSIS — K317 Polyp of stomach and duodenum: Secondary | ICD-10-CM | POA: Diagnosis not present

## 2019-06-21 DIAGNOSIS — Z86010 Personal history of colonic polyps: Secondary | ICD-10-CM | POA: Diagnosis present

## 2019-06-21 DIAGNOSIS — K2970 Gastritis, unspecified, without bleeding: Secondary | ICD-10-CM

## 2019-06-21 DIAGNOSIS — K3189 Other diseases of stomach and duodenum: Secondary | ICD-10-CM | POA: Diagnosis not present

## 2019-06-21 DIAGNOSIS — D124 Benign neoplasm of descending colon: Secondary | ICD-10-CM | POA: Diagnosis not present

## 2019-06-21 DIAGNOSIS — R1011 Right upper quadrant pain: Secondary | ICD-10-CM

## 2019-06-21 DIAGNOSIS — K2950 Unspecified chronic gastritis without bleeding: Secondary | ICD-10-CM

## 2019-06-21 DIAGNOSIS — D123 Benign neoplasm of transverse colon: Secondary | ICD-10-CM

## 2019-06-21 MED ORDER — SODIUM CHLORIDE 0.9 % IV SOLN: 500.0000 mL | INTRAVENOUS | Status: AC

## 2019-06-21 NOTE — Op Note (Signed)
[facility] Patient Name: Rachael Wells Procedure Date: 06/21/2019 3:02 PM MRN: [id number] Endoscopist: Jackquline Denmark , MD Age: 59 Referring MD:  Date of Birth: 03-16-1960 Gender: Female Account #: [id number] Procedure:                Upper GI endoscopy Indications:              Abdominal pain in the right upper quadrant Medicines:                Monitored Anesthesia Care Procedure:                Pre-Anesthesia Assessment:                           - Prior to the procedure, a History and Physical                            was performed, and patient medications and                            allergies were reviewed. The patient's tolerance of                            previous anesthesia was also reviewed. The risks                            and benefits of the procedure and the sedation                            options and risks were discussed with the patient.                            All questions were answered, and informed consent                            was obtained. Prior Anticoagulants: The patient has                            taken no previous anticoagulant or antiplatelet                            agents. ASA Grade Assessment: II - A patient with                            mild systemic disease. After reviewing the risks                            and benefits, the patient was deemed in                            satisfactory condition to undergo the procedure.                           After obtaining informed consent, the endoscope was  passed under direct vision. Throughout the                            procedure, the patient's blood pressure, pulse, and                            oxygen saturations were monitored continuously. The                            Endoscope was introduced through the mouth, and                            advanced to the second part of duodenum. The upper                            GI endoscopy  was accomplished without difficulty.                            The patient tolerated the procedure well. Scope In: Scope Out: Findings:                 The examined esophagus was normal with well defined                            Z-line at 38 cm..                           Localized mild inflammation characterized by                            erythema was found in the gastric antrum. Biopsies                            were taken with a cold forceps for histology.                           A single 4 mm sessile polyp with no stigmata of                            recent bleeding was found in the cardia. The polyp                            was removed with a cold biopsy forceps. Resection                            and retrieval were complete.                           The examined duodenum was normal. Biopsies for                            histology were taken with a cold forceps for  evaluation of celiac disease. Complications:            No immediate complications. Estimated Blood Loss:     Estimated blood loss: none. Impression:               - Mild gastritis.                           - A single gastric polyp. Resected and retrieved. Recommendation:           - Patient has a contact number available for                            emergencies. The signs and symptoms of potential                            delayed complications were discussed with the                            patient. Return to normal activities tomorrow.                            Written discharge instructions were provided to the                            patient.                           - Resume previous diet.                           - Continue present medications.                           - Await pathology results.                           - Proceed with colonoscopy. Jackquline Denmark, MD 06/21/2019 3:47:37 PM This report has been signed electronically.

## 2019-06-21 NOTE — Patient Instructions (Signed)
Discharge instructions given. Handouts on polyps,diverticulosis and hemorrhoids, Gastritis. Clip card given for 2 that were place in Transverse colon. No aspirin,ibuprofen,naproxen,or other anti-inflammatory drugs for 7 days. Office will call for follow up in 6 to 8 weeks. Work note given for today and tomorrow per Dr. Lyndel Safe. YOU HAD AN ENDOSCOPIC PROCEDURE TODAY AT [facility]:   Refer to the procedure report that was given to you for any specific questions about what was found during the examination.  If the procedure report does not answer your questions, please call your gastroenterologist to clarify.  If you requested that your care partner not be given the details of your procedure findings, then the procedure report has been included in a sealed envelope for you to review at your convenience later.  YOU SHOULD EXPECT: Some feelings of bloating in the abdomen. Passage of more gas than usual.  Walking can help get rid of the air that was put into your GI tract during the procedure and reduce the bloating. If you had a lower endoscopy (such as a colonoscopy or flexible sigmoidoscopy) you may notice spotting of blood in your stool or on the toilet paper. If you underwent a bowel prep for your procedure, you may not have a normal bowel movement for a few days.  Please Note:  You might notice some irritation and congestion in your nose or some drainage.  This is from the oxygen used during your procedure.  There is no need for concern and it should clear up in a day or so.  SYMPTOMS TO REPORT IMMEDIATELY:   Following lower endoscopy (colonoscopy or flexible sigmoidoscopy):  Excessive amounts of blood in the stool  Significant tenderness or worsening of abdominal pains  Swelling of the abdomen that is new, acute  Fever of 100F or higher   Following upper endoscopy (EGD)  Vomiting of blood or coffee ground material  New chest pain or pain under the shoulder blades  Painful  or persistently difficult swallowing  New shortness of breath  Fever of 100F or higher  Black, tarry-looking stools  For urgent or emergent issues, a gastroenterologist can be reached at any hour by calling [phone]. Do not use MyChart messaging for urgent concerns.    DIET:  We do recommend a small meal at first, but then you may proceed to your regular diet.  Drink plenty of fluids but you should avoid alcoholic beverages for 24 hours.  ACTIVITY:  You should plan to take it easy for the rest of today and you should NOT DRIVE or use heavy machinery until tomorrow (because of the sedation medicines used during the test).    FOLLOW UP: Our staff will call the number listed on your records 48-72 hours following your procedure to check on you and address any questions or concerns that you may have regarding the information given to you following your procedure. If we do not reach you, we will leave a message.  We will attempt to reach you two times.  During this call, we will ask if you have developed any symptoms of COVID 19. If you develop any symptoms (ie: fever, flu-like symptoms, shortness of breath, cough etc.) before then, please call [phone].  If you test positive for Covid 19 in the 2 weeks post procedure, please call and report this information to [place].    If any biopsies were taken you will be contacted by phone or by letter within the next 1-3 weeks.  Please call  us at [phone] if you have not heard about the biopsies in 3 weeks.    SIGNATURES/CONFIDENTIALITY: You and/or your care partner have signed paperwork which will be entered into your electronic medical record.  These signatures attest to the fact that that the information above on your After Visit Summary has been reviewed and is understood.  Full responsibility of the confidentiality of this discharge information lies with you and/or your care-partner.

## 2019-06-21 NOTE — Op Note (Signed)
[facility] Patient Name: Rachael Wells Procedure Date: 06/21/2019 3:01 PM MRN: [id number] Endoscopist: Jackquline Denmark , MD Age: 59 Referring MD:  Date of Birth: [birth year] Gender: Female Account #: [id number] Procedure:                Colonoscopy Indications:              High risk colon cancer surveillance: Personal                            history of colonic polyps Medicines:                Monitored Anesthesia Care Procedure:                Pre-Anesthesia Assessment:                           - Prior to the procedure, a History and Physical                            was performed, and patient medications and                            allergies were reviewed. The patient's tolerance of                            previous anesthesia was also reviewed. The risks                            and benefits of the procedure and the sedation                            options and risks were discussed with the patient.                            All questions were answered, and informed consent                            was obtained. Prior Anticoagulants: The patient has                            taken no previous anticoagulant or antiplatelet                            agents. ASA Grade Assessment: II - A patient with                            mild systemic disease. After reviewing the risks                            and benefits, the patient was deemed in                            satisfactory condition to undergo the procedure.  After obtaining informed consent, the colonoscope                            was passed under direct vision. Throughout the                            procedure, the patient's blood pressure, pulse, and                            oxygen saturations were monitored continuously. The                            Colonoscope was introduced through the anus and                            advanced to the 2 cm into the ileum. The                             colonoscopy was performed without difficulty. The                            patient tolerated the procedure well. The quality                            of the bowel preparation was good. The terminal                            ileum, ileocecal valve, appendiceal orifice, and                            rectum were photographed. Scope In: 3:16:36 PM Scope Out: 3:41:51 PM Scope Withdrawal Time: 0 hours 17 minutes 5 seconds  Total Procedure Duration: 0 hours 25 minutes 15 seconds  Findings:                 A 20 mm laterally spreading (LST) polyp was found                            in the mid transverse colon, at the area of tattoo,                            examined by NBI. The polyp was sessile. The polyp                            was removed with a piecemeal technique using a hot                            snare. Resection and retrieval were complete. To                            prevent bleeding after the polypectomy, two  hemostatic clips were successfully placed (MR                            conditional). There was no bleeding at the end of                            the procedure.                           A 6 mm polyp was found in the mid descending colon.                            The polyp was sessile. The polyp was removed with a                            cold snare. Resection and retrieval were complete.                           A few small-mouthed diverticula were found in the                            sigmoid colon.                           Non-bleeding internal hemorrhoids were found during                            retroflexion. The hemorrhoids were small.                           The terminal ileum appeared normal.                           The exam was otherwise without abnormality on                            direct and retroflexion views. Complications:            No immediate complications. Estimated  Blood Loss:     Estimated blood loss: none. Impression:               - Colonic polyps s/p polypectomy.                           - Mild sigmoid diverticulosis.                           - Non-bleeding internal hemorrhoids.                           - The examined portion of the ileum was normal.                           - The examination was otherwise normal on direct  and retroflexion views. Recommendation:           - Patient has a contact number available for                            emergencies. The signs and symptoms of potential                            delayed complications were discussed with the                            patient. Return to normal activities tomorrow.                            Written discharge instructions were provided to the                            patient.                           - Resume previous diet.                           - Continue present medications.                           - No aspirin, ibuprofen, naproxen, or other                            non-steroidal anti-inflammatory drugs for 7 days                            after polyp removal. She does understand that there                            is a higher chance of post polypectomy                            complications including bleeding.                           - Await pathology results.                           - Repeat colonoscopy for surveillance based on                            pathology results.                           - The findings and recommendations were discussed                            with the patient. I would also alert physician on                            call.                           -  FU in 6 to 8 weeks. Jackquline Denmark, MD 06/21/2019 3:56:06 PM This report has been signed electronically.

## 2019-06-21 NOTE — Progress Notes (Signed)
Vs cw  

## 2019-06-21 NOTE — Progress Notes (Signed)
Called to room to assist during endoscopic procedure.  Patient ID and intended procedure confirmed with present staff. Received instructions for my participation in the procedure from the performing physician.  

## 2019-06-21 NOTE — Progress Notes (Signed)
Report given to PACU, vss 

## 2019-06-25 ENCOUNTER — Telehealth: Payer: Self-pay

## 2019-06-25 NOTE — Telephone Encounter (Signed)
Covid-19 screening questions   Do you now or have you had a fever in the last 14 days? No.  Do you have any respiratory symptoms of shortness of breath or cough now or in the last 14 days? No.  Do you have any family members or close contacts with diagnosed or suspected Covid-19 in the past 14 days? No.  Have you been tested for Covid-19 and found to be positive? No.       Follow up Call-  Call back number 06/21/2019 06/18/2018  Post procedure Call Back phone  # [phone]  Permission to leave phone message Yes Yes  Some recent data might be hidden     Patient questions:  Do you have a fever, pain , or abdominal swelling? No. Pain Score  0 *  Have you tolerated food without any problems? Yes.    Have you been able to return to your normal activities? Yes.    Do you have any questions about your discharge instructions: Diet   No. Medications  No. Follow up visit  No.  Do you have questions or concerns about your Care? Yes.  Pt. Asked when she would hear results from pathology.  Told pt. She should receive a letter in around 2 weeks containing her results.  Actions: * If pain score is 4 or above: No action needed, pain <4.

## 2019-06-26 ENCOUNTER — Telehealth: Payer: Self-pay

## 2019-06-26 NOTE — Telephone Encounter (Signed)
Patient called asking about questions regarding pathology report from her procedure from [phone]. I have printed it off for you to look at please but it is in her chart.

## 2019-06-30 ENCOUNTER — Encounter: Payer: Self-pay | Admitting: Gastroenterology

## 2019-07-01 ENCOUNTER — Telehealth: Payer: Self-pay | Admitting: Gastroenterology

## 2019-07-02 NOTE — Telephone Encounter (Signed)
Discussed in detail Having reflux mostly at night Would like to try something  Plan: -Protonix 40 mg p.o. daily (1/2 HR before supper) #30, 6 refills [facility], please call [facility] into [facility] [place] will call if still with problems -I have also urged her to cut down and stop smoking.  RG

## 2019-07-03 ENCOUNTER — Other Ambulatory Visit: Payer: Self-pay

## 2019-07-03 MED ORDER — PANTOPRAZOLE SODIUM 40 MG PO TBEC: 40.0000 mg | DELAYED_RELEASE_TABLET | Freq: Every day | ORAL | 6 refills | Status: AC

## 2019-07-03 NOTE — Telephone Encounter (Signed)
Spoke with patient today.  Protonix  40 mg sent to [facility] [place],  Pt advised to cut back/stop smoking. Patient verbalized understanding.

## 2019-08-28 ENCOUNTER — Ambulatory Visit: Payer: 59 | Admitting: Gastroenterology

## 2020-02-04 IMAGING — CT CT NECK W/ CM
5 of 6 series · 14 of 33 positions shown, 16 images · IV contrast (iopamidol)
Comparison: Neck CT 09/07/2020

CLINICAL DATA: Neck abscess. Enlarged lymph nodes. Additional
history provided: Enlarged lymph nodes right side area marked with
vitamin E tablet.

EXAM:
CT NECK WITH CONTRAST
TECHNIQUE: Multidetector CT imaging of the neck was performed using the
standard protocol following the bolus administration of intravenous
contrast.
CONTRAST:  75mL [id number]-AWW IOPAMIDOL (H4I85B-AWW) INJECTION 61%

[Series 3: neck 2.00 br40 s3 st/ no angle · axial · 0.44mm/px · z∈[-758,-672]mm · 2 of 130 slices shown, 3 images]
[im 44/130  soft-tissue]
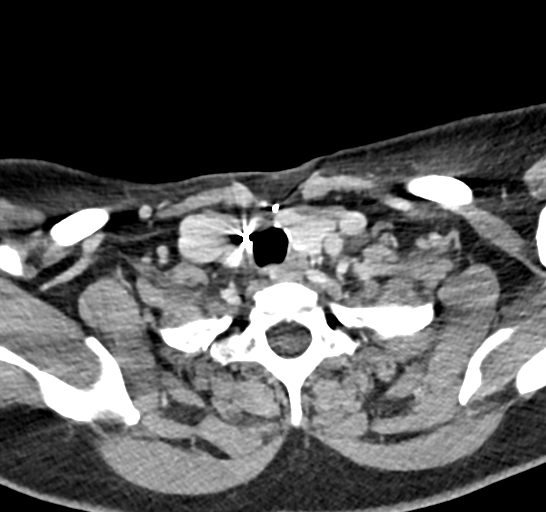
[im 44/130  bone]
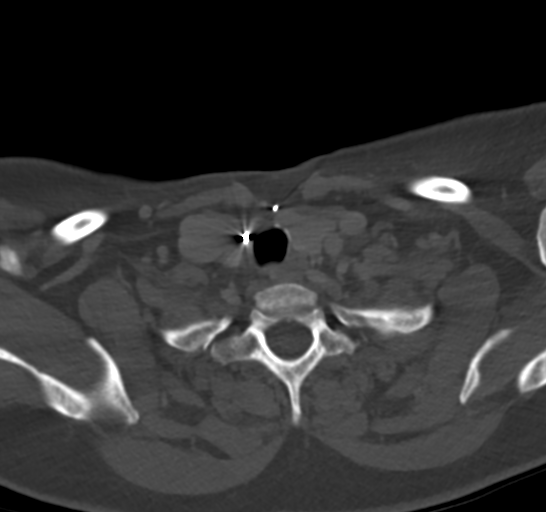
[im 87/130  bone]
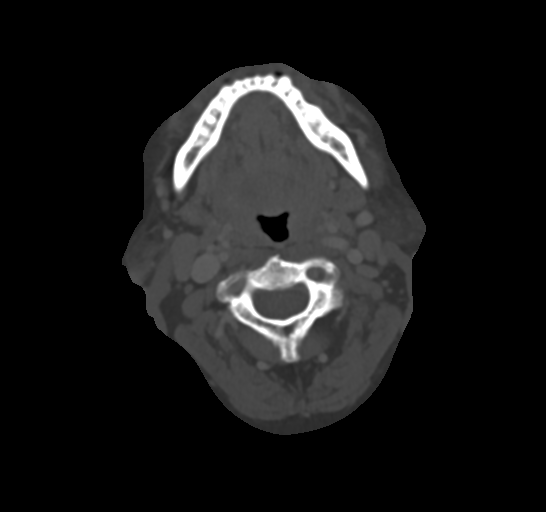

[Series 5: neck 2.00 br60 s3 bone/ no angle · axial · 0.44mm/px · z∈[-758,-672]mm · 2 of 130 slices shown]
[im 44/130  bone]
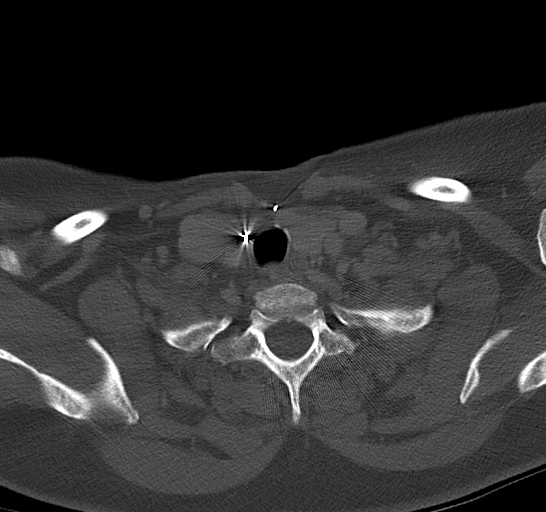
[im 87/130  bone]
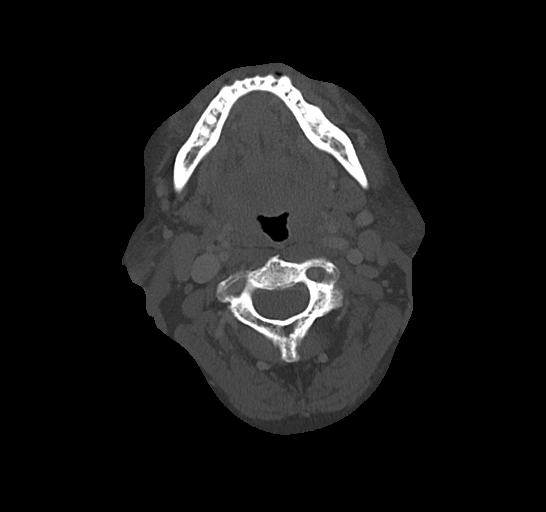

[Series 7: neck 2.00 br36 s3 angled axial (person_name) · axial · 0.44mm/px · z∈[-776,-691]mm · 2 of 129 slices shown]
[im 43/129  bone]
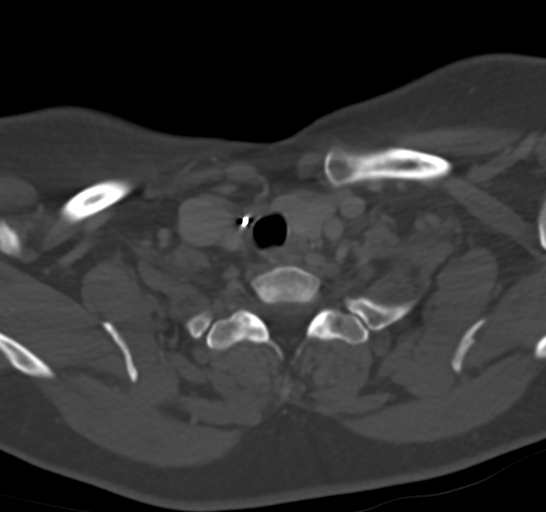
[im 86/129  bone]
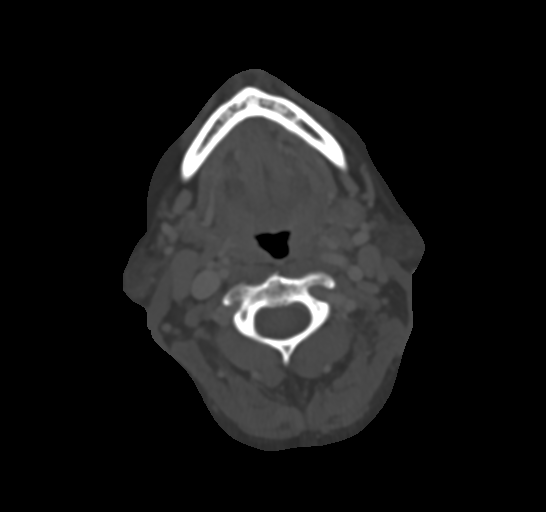

[Series 11: neck 2.00 br40 s3 (person_name) · coronal · 0.47mm/px · 3 of 111 slices shown (1 of 2)]
[im 23/111  bone]
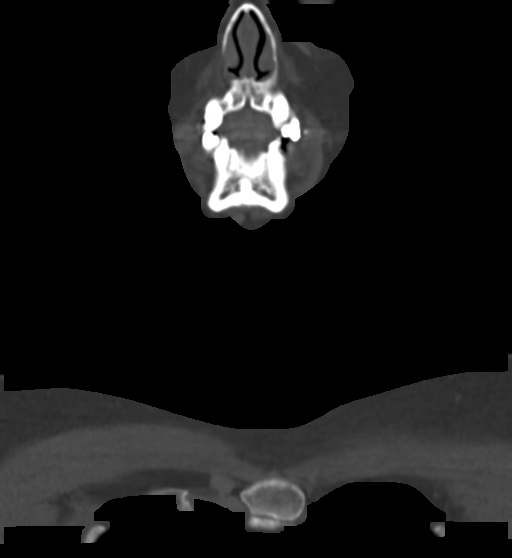
[im 45/111  bone]
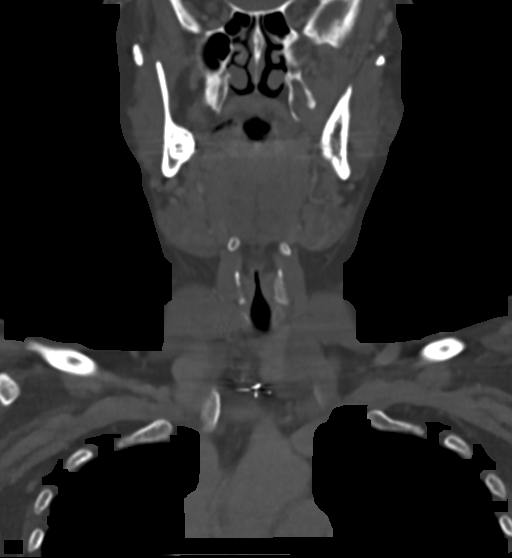
[im 67/111  bone]
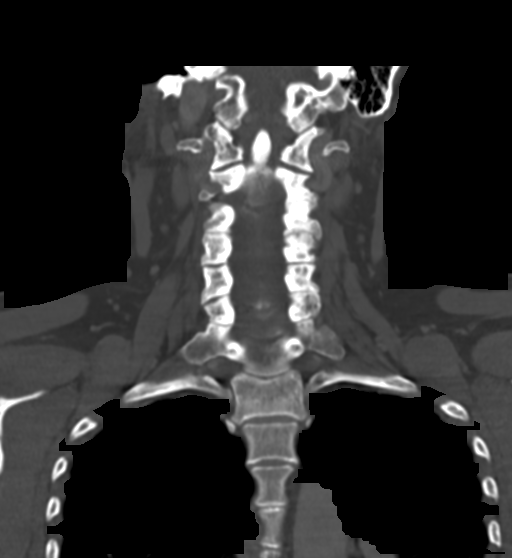

[Series 13: neck 2.00 br40 s3 (person_name) · sagittal · 0.44mm/px · 5 of 118 slices shown, 6 images (2 of 2)]
[im 40/118  bone]
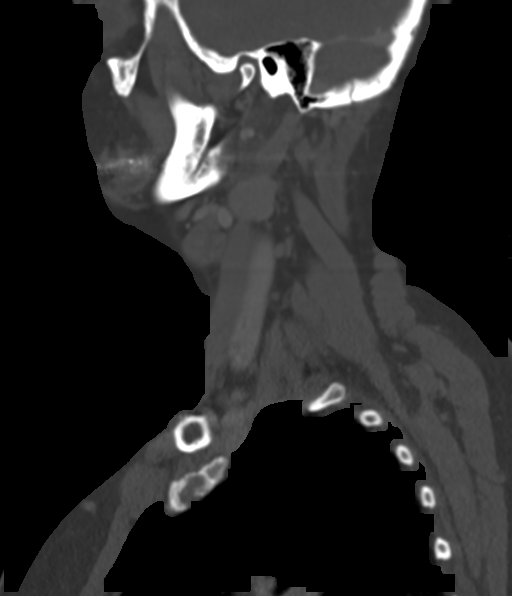
[im 49/118  bone]
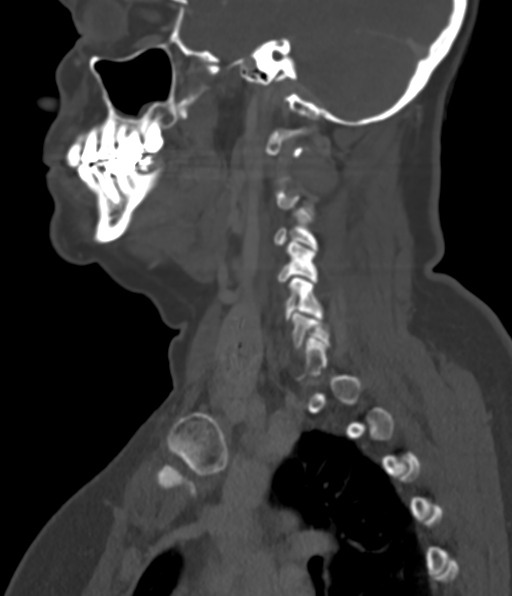
[im 59/118  soft-tissue]
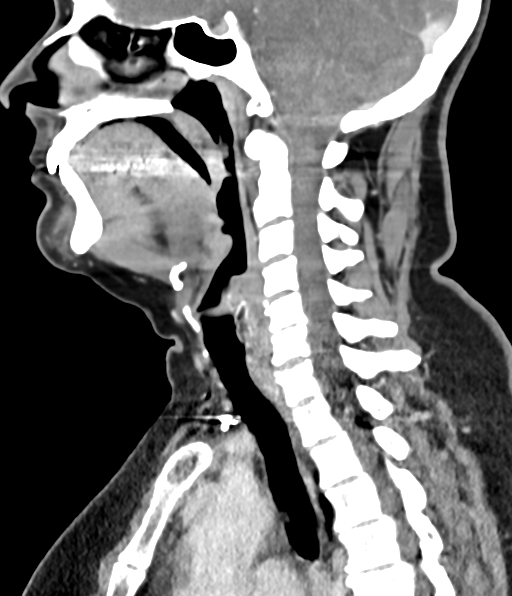
[im 59/118  bone]
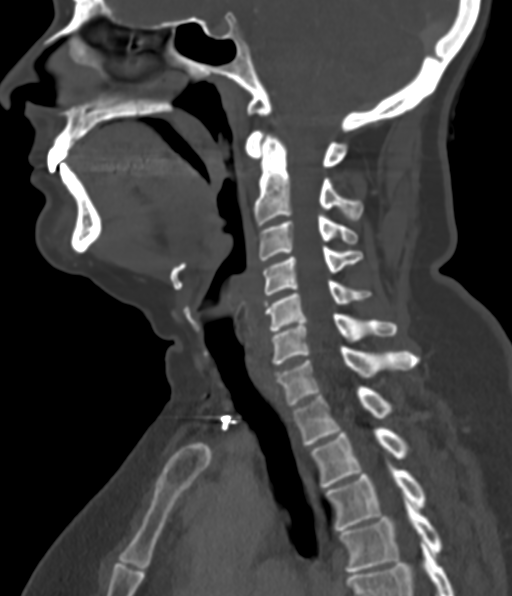
[im 69/118  bone]
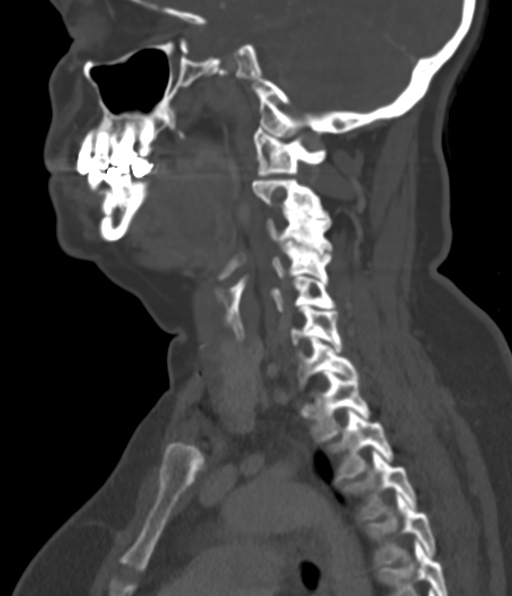
[im 79/118  bone]
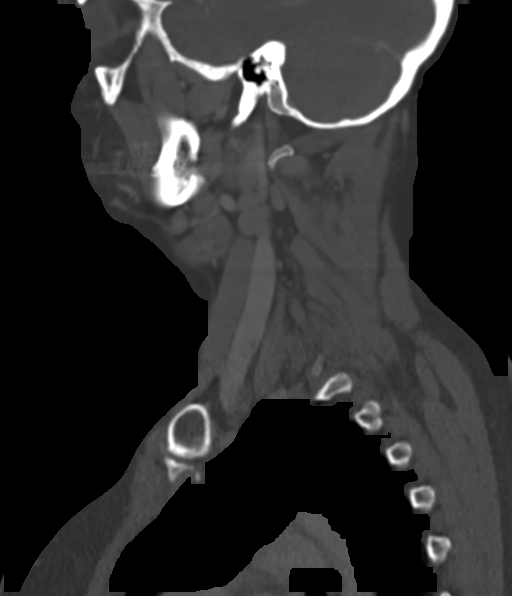

[14 of 33 positions shown; findings below may reference images not displayed]

FINDINGS: Pharynx and larynx:

Streak artifact from dental restoration somewhat limits evaluation
of the oral cavity. No appreciable mass or swelling within the
nasopharynx, oral cavity/oropharynx, hypopharynx or larynx.

Salivary glands: Previously demonstrated inflammatory changes of the
right submandibular gland have essentially resolved. There is a
persistent small amount of stranding surrounding the inferior right
parotid gland. There is a somewhat prominent lymph node within the
inferior right parotid gland, although subcentimeter in short axis.
No discrete right parotid gland mass is demonstrated (as previously
questioned). The left parotid and submandibular glands are normal.

Thyroid: Sequela of prior right hemithyroidectomy.

Lymph nodes: There is a persistently enlarged right level II lymph
node measuring 2.4 x 1.9 cm (series 13, image 40) (previously 2.9 x
1.9 cm).

Vascular: The major vascular structures of the neck appear patent.

Limited intracranial: Unremarkable

Visualized orbits: Incompletely imaged. No evidence of acute
abnormality.

Mastoids and visualized paranasal sinuses: Mild ethmoid sinus
mucosal thickening. Small amount of frothy secretions within the
left sphenoid sinus. Mild right maxillary sinus mucosal thickening.
No significant mastoid effusion.

Skeleton: No acute bony abnormality. Redemonstrated cervical
spondylosis with advanced facet arthropathy. Small C5-C6 posterior
disc osteophyte complex.

Upper chest: No consolidation
IMPRESSION: 1. Previously demonstrated right submandibular gland inflammatory
changes have essentially resolved since prior exam 09/08/2018.
[DATE]. Minimal persistent stranding along the inferior aspect of the
right parotid gland, which may reflect residua of previously
demonstrated right parotiditis. A mildly prominent lymph node within
the inferior aspect of the right parotid gland and a persistently
enlarged right level II lymph node may be reactive. Close clinical
follow-up is recommended to ensure resolution with repeat imaging as
clinically warranted.
3. No abscess is present within the neck.

## 2020-09-21 ENCOUNTER — Encounter: Payer: Self-pay | Admitting: Gastroenterology

## 2021-02-18 ENCOUNTER — Encounter: Payer: Self-pay | Admitting: Gastroenterology

## 2021-02-22 ENCOUNTER — Other Ambulatory Visit: Payer: Self-pay

## 2021-02-22 ENCOUNTER — Ambulatory Visit (AMBULATORY_SURGERY_CENTER): Payer: 59 | Admitting: *Deleted

## 2021-02-22 VITALS — Ht 65.0 in | Wt 170.0 lb

## 2021-02-22 DIAGNOSIS — Z86010 Personal history of colonic polyps: Secondary | ICD-10-CM

## 2021-02-22 MED ORDER — NA SULFATE-K SULFATE-MG SULF 17.5-3.13-1.6 GM/177ML PO SOLN: 2.0000 | Freq: Once | ORAL | 0 refills | Status: AC

## 2021-02-22 NOTE — Progress Notes (Signed)

## 2021-03-05 ENCOUNTER — Encounter: Payer: Self-pay | Admitting: Gastroenterology

## 2021-03-08 ENCOUNTER — Encounter: Payer: Self-pay | Admitting: Gastroenterology

## 2021-03-08 ENCOUNTER — Ambulatory Visit (AMBULATORY_SURGERY_CENTER): Payer: Managed Care, Other (non HMO) | Admitting: Gastroenterology

## 2021-03-08 ENCOUNTER — Other Ambulatory Visit: Payer: Self-pay

## 2021-03-08 VITALS — BP 94/49 | HR 71 | Temp 98.6°F | Resp 17 | Ht 65.0 in | Wt 170.0 lb

## 2021-03-08 DIAGNOSIS — Z86010 Personal history of colonic polyps: Secondary | ICD-10-CM

## 2021-03-08 DIAGNOSIS — D122 Benign neoplasm of ascending colon: Secondary | ICD-10-CM

## 2021-03-08 MED ORDER — SODIUM CHLORIDE 0.9 % IV SOLN: 500.0000 mL | Freq: Once | INTRAVENOUS | Status: DC

## 2021-03-08 NOTE — Progress Notes (Signed)
Called to room to assist during endoscopic procedure.  Patient ID and intended procedure confirmed with present staff. Received instructions for my participation in the procedure from the performing physician.  

## 2021-03-08 NOTE — Patient Instructions (Signed)
Handouts Provided:  High Fiber Diet ? ?YOU HAD AN ENDOSCOPIC PROCEDURE TODAY AT [facility]:   Refer to the procedure report that was given to you for any specific questions about what was found during the examination.  If the procedure report does not answer your questions, please call your gastroenterologist to clarify.  If you requested that your care partner not be given the details of your procedure findings, then the procedure report has been included in a sealed envelope for you to review at your convenience later. ? ?YOU SHOULD EXPECT: Some feelings of bloating in the abdomen. Passage of more gas than usual.  Walking can help get rid of the air that was put into your GI tract during the procedure and reduce the bloating. If you had a lower endoscopy (such as a colonoscopy or flexible sigmoidoscopy) you may notice spotting of blood in your stool or on the toilet paper. If you underwent a bowel prep for your procedure, you may not have a normal bowel movement for a few days. ? ?Please Note:  You might notice some irritation and congestion in your nose or some drainage.  This is from the oxygen used during your procedure.  There is no need for concern and it should clear up in a day or so. ? ?SYMPTOMS TO REPORT IMMEDIATELY: ? ?Following lower endoscopy (colonoscopy or flexible sigmoidoscopy): ? Excessive amounts of blood in the stool ? Significant tenderness or worsening of abdominal pains ? Swelling of the abdomen that is new, acute ? Fever of 100?F or higher ? ?For urgent or emergent issues, a gastroenterologist can be reached at any hour by calling [phone]. ?Do not use MyChart messaging for urgent concerns.  ? ? ?DIET:  We do recommend a small meal at first, but then you may proceed to your regular diet.  Drink plenty of fluids but you should avoid alcoholic beverages for 24 hours. ? ?ACTIVITY:  You should plan to take it easy for the rest of today and you should NOT DRIVE or use  heavy machinery until tomorrow (because of the sedation medicines used during the test).   ? ?FOLLOW UP: ?Our staff will call the number listed on your records 48-72 hours following your procedure to check on you and address any questions or concerns that you may have regarding the information given to you following your procedure. If we do not reach you, we will leave a message.  We will attempt to reach you two times.  During this call, we will ask if you have developed any symptoms of COVID 19. If you develop any symptoms (ie: fever, flu-like symptoms, shortness of breath, cough etc.) before then, please call [phone].  If you test positive for Covid 19 in the 2 weeks post procedure, please call and report this information to [place].   ? ?If any biopsies were taken you will be contacted by phone or by letter within the next 1-3 weeks.  Please call us at [phone] if you have not heard about the biopsies in 3 weeks.  ? ? ?SIGNATURES/CONFIDENTIALITY: ?You and/or your care partner have signed paperwork which will be entered into your electronic medical record.  These signatures attest to the fact that that the information above on your After Visit Summary has been reviewed and is understood.  Full responsibility of the confidentiality of this discharge information lies with you and/or your care-partner. ? ?

## 2021-03-08 NOTE — Progress Notes (Signed)
To Pacu, VSS. Report to [email or web address] 

## 2021-03-08 NOTE — Progress Notes (Signed)
VS by DT  Pt's states no medical or surgical changes since previsit or office visit.  

## 2021-03-08 NOTE — Op Note (Signed)
[place] ?Patient Name: Rachael Wells ?Procedure Date: 03/08/2021 1:20 PM ?MRN: [id number] ?Endoscopist: Jackquline Denmark , MD ?Age: 61 ?Referring MD:  ?Date of Birth: [birth year] ?Gender: Female ?Account #: [id number] ?Procedure:                Colonoscopy ?Indications:              High risk colon cancer surveillance: Personal  ?                          history of colonic polyps (LST s/p EMR 06/2019) ?Medicines:                Monitored Anesthesia Care ?Procedure:                Pre-Anesthesia Assessment: ?                          - Prior to the procedure, a History and Physical  ?                          was performed, and patient medications and  ?                          allergies were reviewed. The patient's tolerance of  ?                          previous anesthesia was also reviewed. The risks  ?                          and benefits of the procedure and the sedation  ?                          options and risks were discussed with the patient.  ?                          All questions were answered, and informed consent  ?                          was obtained. Prior Anticoagulants: The patient has  ?                          taken no previous anticoagulant or antiplatelet  ?                          agents. ASA Grade Assessment: II - A patient with  ?                          mild systemic disease. After reviewing the risks  ?                          and benefits, the patient was deemed in  ?                          satisfactory condition to undergo the procedure. ?  After obtaining informed consent, the colonoscope  ?                          was passed under direct vision. Throughout the  ?                          procedure, the patient's blood pressure, pulse, and  ?                          oxygen saturations were monitored continuously. The  ?                          PCF-HQ190L Colonoscope was introduced through the  ?                          anus and advanced to  the 2 cm into the ileum. The  ?                          colonoscopy was performed without difficulty. The  ?                          patient tolerated the procedure well. The quality  ?                          of the bowel preparation was good. The terminal  ?                          ileum, ileocecal valve, appendiceal orifice, and  ?                          rectum were photographed. ?Scope In: 1:24:29 PM ?Scope Out: 1:44:38 PM ?Scope Withdrawal Time: 0 hours 12 minutes 53 seconds  ?Total Procedure Duration: 0 hours 20 minutes 9 seconds  ?Findings:                 Two sessile polyps were found in the proximal  ?                          ascending colon and mid ascending colon. The polyps  ?                          were 2 to 4 mm in size. These polyps were removed  ?                          with a cold snare. Resection and retrieval were  ?                          complete. A tattoo was identified in the proximal  ?                          transverse colon. Careful examination did not  ?  reveal any residual or recurrent polyps in that  ?                          area. ?                          Multiple medium-mouthed diverticula were found in  ?                          the sigmoid colon. ?                          Non-bleeding internal hemorrhoids were found during  ?                          retroflexion. The hemorrhoids were Grade I  ?                          (internal hemorrhoids that do not prolapse). ?                          The terminal ileum appeared normal. ?                          The exam was otherwise without abnormality on  ?                          direct and retroflexion views. ?Complications:            No immediate complications. ?Estimated Blood Loss:     Estimated blood loss: none. ?Impression:               - Two 2 to 4 mm polyps in the proximal ascending  ?                          colon and in the mid ascending colon, removed with  ?                           a cold snare. Resected and retrieved. ?                          - Moderate sigmoid diverticulosis. ?                          - Non-bleeding internal hemorrhoids. ?                          - The examined portion of the ileum was normal. ?                          - The examination was otherwise normal on direct  ?                          and retroflexion views. ?Recommendation:           - Patient has a contact number available for  ?  emergencies. The signs and symptoms of potential  ?                          delayed complications were discussed with the  ?                          patient. Return to normal activities tomorrow.  ?                          Written discharge instructions were provided to the  ?                          patient. ?                          - High fiber diet. ?                          - Continue present medications. ?                          - Await pathology results. ?                          - Repeat colonoscopy in 3 years for surveillance.  ?                          Earlier, if with any new problems or change in  ?                          family history. ?                          - The findings and recommendations were discussed  ?                          with the patient's family. ?Jackquline Denmark, MD ?03/08/2021 1:49:35 PM ?This report has been signed electronically. ?

## 2021-03-08 NOTE — Progress Notes (Signed)
[facility] History and Physical ? ? ?Primary Care Physician:  Imagene Riches, NP ? ? ?Reason for Procedure:   History of polyps ? ?Plan:     colonoscopy ? ? ? ? ?HPI: Rachael Wells is a 61 y.o. female  ? ? ?Past Medical History:  ?Diagnosis Date  ? Allergy   ? Anemia   ? Anxiety   ? Arthritis   ? Degenerative cervical disc   ? Depression   ? GERD (gastroesophageal reflux disease)   ? History of colon polyps   ? Thyroid disease   ? ? ?Past Surgical History:  ?Procedure Laterality Date  ? BREAST BIOPSY Bilateral   ? CHOLECYSTECTOMY    ? COLONOSCOPY  02/21/2014  ? Rectal polyp-status post polypectomy. Mild sigmoid diverticulosis. Small internal hemorrhoids  ? THYROIDECTOMY, PARTIAL Right   ? TUBAL LIGATION    ? ? ?Prior to Admission medications   ?Medication Sig Start Date End Date Taking? Authorizing Provider  ?citalopram (CELEXA) 20 MG tablet Take 20 mg by mouth daily.   Yes [provider]  ?acyclovir (ZOVIRAX) 400 MG tablet acyclovir 400 mg tablet ? TK 1 T PO PRN UTD ?Patient not taking: No sig reported    [provider]  ?Cyanocobalamin (VITAMIN B-12 PO) Take by mouth as needed.  ?Patient not taking: Reported on 06/21/2019    [provider]  ?Multiple Vitamin (MULTIVITAMIN) tablet Take 1 tablet by mouth as needed.    [provider]  ?pantoprazole (PROTONIX) 40 MG tablet Take 1 tablet (40 mg total) by mouth daily. Take 1/2 hour before supper ?Patient not taking: Reported on 02/22/2021 07/03/19   Jackquline Denmark, MD  ? ? ?Current Outpatient Medications  ?Medication Sig Dispense Refill  ? citalopram (CELEXA) 20 MG tablet Take 20 mg by mouth daily.    ? acyclovir (ZOVIRAX) 400 MG tablet acyclovir 400 mg tablet ? TK 1 T PO PRN UTD (Patient not taking: No sig reported)    ? Cyanocobalamin (VITAMIN B-12 PO) Take by mouth as needed.  (Patient not taking: Reported on 06/21/2019)    ? Multiple Vitamin (MULTIVITAMIN) tablet Take 1 tablet by mouth as needed.    ? pantoprazole  (PROTONIX) 40 MG tablet Take 1 tablet (40 mg total) by mouth daily. Take 1/2 hour before supper (Patient not taking: Reported on 02/22/2021) 30 tablet 6  ? ?Current Facility-Administered Medications  ?Medication Dose Route Frequency Provider Last Rate Last Admin  ? 0.9 %  sodium chloride infusion  500 mL Intravenous Continuous Jackquline Denmark, MD      ? 0.9 %  sodium chloride infusion  500 mL Intravenous Once Jackquline Denmark, MD      ? ? ?Allergies as of 03/08/2021  ? (No Known Allergies)  ? ? ?Family History  ?Problem Relation Age of Onset  ? Colon cancer Father   ? Rectal cancer Father   ? Colon polyps Brother   ? Breast cancer Neg Hx   ? Esophageal cancer Neg Hx   ? Stomach cancer Neg Hx   ? ? ?Social History  ? ?Socioeconomic History  ? Marital status: Married  ?  Spouse name: Not on file  ? Number of children: Not on file  ? Years of education: Not on file  ? Highest education level: Not on file  ?Occupational History  ? Not on file  ?Tobacco Use  ? Smoking status: Every Day  ?  Types: Cigarettes  ? Smokeless tobacco: Never  ?Vaping Use  ?  Vaping Use: Never used  ?Substance and Sexual Activity  ? Alcohol use: Yes  ?  Comment: occasional beer or mix drink  ? Drug use: Never  ? Sexual activity: Not on file  ?Other Topics Concern  ? Not on file  ?Social History Narrative  ? Not on file  ? ?Social Determinants of Health  ? ?Financial Resource Strain: Not on file  ?Food Insecurity: Not on file  ?Transportation Needs: Not on file  ?Physical Activity: Not on file  ?Stress: Not on file  ?Social Connections: Not on file  ?Intimate Partner Violence: Not on file  ? ? ?Review of Systems: ?Positive for none ?All other review of systems negative except as mentioned in the HPI. ? ?Physical Exam: ?Vital signs in last 24 hours: ?'@VSRANGES'$ @ ?  ?General:   Alert,  Well-developed, well-nourished, pleasant and cooperative in NAD ?Lungs:  Clear throughout to auscultation.   ?Heart:  Regular rate and rhythm; no murmurs, clicks, rubs,  or  gallops. ?Abdomen:  Soft, nontender and nondistended. Normal bowel sounds.   ?Neuro/Psych:  Alert and cooperative. Normal mood and affect. A and O x 3 ? ? ? ?No significant changes were identified.  The patient continues to be an appropriate candidate for the planned procedure and anesthesia. ? ? ?Carmell Austria, MD. ?[facility] ?03/08/2021 1:20 PM@ ? ?

## 2021-03-10 ENCOUNTER — Telehealth: Payer: Self-pay

## 2021-03-10 NOTE — Telephone Encounter (Signed)
?  Follow up Call- ? ?Call back number 03/08/2021 06/21/2019 06/18/2018  ?Post procedure Call Back phone  # [phone]  ?Permission to leave phone message Yes Yes Yes  ?Some recent data might be hidden  ?  ? ?Patient questions: ? ?Do you have a fever, pain , or abdominal swelling? No. ?Pain Score  0 * ? ?Have you tolerated food without any problems? Yes.   ? ?Have you been able to return to your normal activities? Yes.   ? ?Do you have any questions about your discharge instructions: ?Diet   No. ?Medications  No. ?Follow up visit  No. ? ?Do you have questions or concerns about your Care? No. ? ?Actions: ?* If pain score is 4 or above: ?No action needed, pain <4. ? ?Have you developed a fever since your procedure? no ? ?2.   Have you had an respiratory symptoms (SOB or cough) since your procedure? no ? ?3.   Have you tested positive for COVID 19 since your procedure on ? ?4.   Have you had any family members/close contacts diagnosed with the COVID 19 since your procedure?  no ? ? ?If yes to any of these questions please route to Joylene John, RN and Joella Prince, RN ? ?

## 2021-03-11 ENCOUNTER — Encounter: Payer: Self-pay | Admitting: Gastroenterology

## 2021-03-14 ENCOUNTER — Encounter: Payer: Self-pay | Admitting: Gastroenterology

## 2023-03-27 ENCOUNTER — Other Ambulatory Visit: Payer: Self-pay | Admitting: Family

## 2023-03-27 DIAGNOSIS — Z0000 Encounter for general adult medical examination without abnormal findings: Secondary | ICD-10-CM

## 2023-03-31 ENCOUNTER — Encounter
